# Patient Record
Sex: Female | Born: 1980 | Race: White | Hispanic: No | Smoking: Current every day smoker
Health system: Southern US, Community
[De-identification: ages and names within clinical notes are randomized; demographics above are authoritative.]

## PROBLEM LIST (undated history)

## (undated) HISTORY — PX: BACK SURGERY: SHX140

---

## 2021-02-22 ENCOUNTER — Encounter (HOSPITAL_BASED_OUTPATIENT_CLINIC_OR_DEPARTMENT_OTHER): Payer: Self-pay | Admitting: *Deleted

## 2021-02-22 ENCOUNTER — Emergency Department (HOSPITAL_BASED_OUTPATIENT_CLINIC_OR_DEPARTMENT_OTHER): Payer: Self-pay

## 2021-02-22 ENCOUNTER — Other Ambulatory Visit: Payer: Self-pay

## 2021-02-22 ENCOUNTER — Emergency Department (HOSPITAL_BASED_OUTPATIENT_CLINIC_OR_DEPARTMENT_OTHER)
Admission: EM | Admit: 2021-02-22 | Discharge: 2021-02-23 | Discharge status: Against Medical Advice | Payer: Self-pay | Attending: Emergency Medicine | Admitting: Emergency Medicine

## 2021-02-22 DIAGNOSIS — Y900 Blood alcohol level of less than 20 mg/100 ml: Secondary | ICD-10-CM | POA: Insufficient documentation

## 2021-02-22 DIAGNOSIS — F17210 Nicotine dependence, cigarettes, uncomplicated: Secondary | ICD-10-CM | POA: Insufficient documentation

## 2021-02-22 DIAGNOSIS — R6510 Systemic inflammatory response syndrome (SIRS) of non-infectious origin without acute organ dysfunction: Secondary | ICD-10-CM | POA: Insufficient documentation

## 2021-02-22 DIAGNOSIS — Z5321 Procedure and treatment not carried out due to patient leaving prior to being seen by health care provider: Secondary | ICD-10-CM | POA: Insufficient documentation

## 2021-02-22 DIAGNOSIS — R000 Tachycardia, unspecified: Secondary | ICD-10-CM | POA: Insufficient documentation

## 2021-02-22 DIAGNOSIS — A419 Sepsis, unspecified organism: Secondary | ICD-10-CM | POA: Diagnosis present

## 2021-02-22 DIAGNOSIS — Z20822 Contact with and (suspected) exposure to covid-19: Secondary | ICD-10-CM | POA: Insufficient documentation

## 2021-02-22 DIAGNOSIS — M545 Low back pain, unspecified: Secondary | ICD-10-CM | POA: Insufficient documentation

## 2021-02-22 LAB — RAPID URINE DRUG SCREEN, HOSP PERFORMED
Amphetamines: POSITIVE — AB
Barbiturates: NOT DETECTED
Benzodiazepines: POSITIVE — AB
Cocaine: POSITIVE — AB
Opiates: NOT DETECTED
Tetrahydrocannabinol: NOT DETECTED

## 2021-02-22 LAB — COMPREHENSIVE METABOLIC PANEL
ALT: 33 U/L (ref 0–44)
AST: 46 U/L — ABNORMAL HIGH (ref 15–41)
Albumin: 3.6 g/dL (ref 3.5–5.0)
Alkaline Phosphatase: 86 U/L (ref 38–126)
Anion gap: 11 (ref 5–15)
BUN: 16 mg/dL (ref 6–20)
CO2: 26 mmol/L (ref 22–32)
Calcium: 8.8 mg/dL — ABNORMAL LOW (ref 8.9–10.3)
Chloride: 101 mmol/L (ref 98–111)
Creatinine, Ser: 0.81 mg/dL (ref 0.44–1.00)
GFR, Estimated: >60 mL/min (ref >60)
Glucose, Bld: 113 mg/dL — ABNORMAL HIGH (ref 70–99)
Potassium: 3.4 mmol/L — ABNORMAL LOW (ref 3.5–5.1)
Sodium: 138 mmol/L (ref 135–145)
Total Bilirubin: 0.5 mg/dL (ref 0.3–1.2)
Total Protein: 8.2 g/dL — ABNORMAL HIGH (ref 6.5–8.1)

## 2021-02-22 LAB — CBC WITH DIFFERENTIAL/PLATELET
Abs Immature Granulocytes: 0.09 10*3/uL — ABNORMAL HIGH (ref 0.00–0.07)
Basophils Absolute: 0.1 10*3/uL (ref 0.0–0.1)
Basophils Relative: 1 %
Eosinophils Absolute: 0 10*3/uL (ref 0.0–0.5)
Eosinophils Relative: 0 %
HCT: 30.9 % — ABNORMAL LOW (ref 36.0–46.0)
Hemoglobin: 9.5 g/dL — ABNORMAL LOW (ref 12.0–15.0)
Immature Granulocytes: 1 %
Lymphocytes Relative: 8 %
Lymphs Abs: 1.5 10*3/uL (ref 0.7–4.0)
MCH: 23.1 pg — ABNORMAL LOW (ref 26.0–34.0)
MCHC: 30.7 g/dL (ref 30.0–36.0)
MCV: 75 fL — ABNORMAL LOW (ref 80.0–100.0)
Monocytes Absolute: 1 10*3/uL (ref 0.1–1.0)
Monocytes Relative: 6 %
Neutro Abs: 15.9 10*3/uL — ABNORMAL HIGH (ref 1.7–7.7)
Neutrophils Relative %: 84 %
Platelets: 645 10*3/uL — ABNORMAL HIGH (ref 150–400)
RBC: 4.12 MIL/uL (ref 3.87–5.11)
RDW: 18.8 % — ABNORMAL HIGH (ref 11.5–15.5)
WBC: 18.6 10*3/uL — ABNORMAL HIGH (ref 4.0–10.5)
nRBC: 0 % (ref 0.0–0.2)

## 2021-02-22 LAB — URINALYSIS, ROUTINE W REFLEX MICROSCOPIC
Glucose, UA: NEGATIVE mg/dL
Hgb urine dipstick: NEGATIVE
Ketones, ur: 15 mg/dL — AB
Leukocytes,Ua: NEGATIVE
Nitrite: NEGATIVE
Protein, ur: 30 mg/dL — AB
Specific Gravity, Urine: >1.03 — ABNORMAL HIGH (ref 1.005–1.030)
pH: 5.5 (ref 5.0–8.0)

## 2021-02-22 LAB — ETHANOL: Alcohol, Ethyl (B): <10 mg/dL (ref <10)

## 2021-02-22 LAB — LACTIC ACID, PLASMA: Lactic Acid, Venous: 1.4 mmol/L (ref 0.5–1.9)

## 2021-02-22 LAB — RESP PANEL BY RT-PCR (FLU A&B, COVID) ARPGX2
Influenza A by PCR: NEGATIVE
Influenza B by PCR: NEGATIVE
SARS Coronavirus 2 by RT PCR: NEGATIVE

## 2021-02-22 LAB — TROPONIN I (HIGH SENSITIVITY)
Troponin I (High Sensitivity): 16 ng/L (ref <18)
Troponin I (High Sensitivity): 15 ng/L (ref <18)

## 2021-02-22 LAB — URINALYSIS, MICROSCOPIC (REFLEX)

## 2021-02-22 LAB — PREGNANCY, URINE: Preg Test, Ur: NEGATIVE

## 2021-02-22 MED ORDER — HYDROMORPHONE HCL 1 MG/ML IJ SOLN
1.0000 mg | Freq: Once | INTRAMUSCULAR | Status: AC
Start: 2021-02-22 — End: 2021-02-23
  Administered 2021-02-23: 1 mg via INTRAVENOUS
  Filled 2021-02-22: qty 1

## 2021-02-22 MED ORDER — SODIUM CHLORIDE 0.9 % IV BOLUS
1000.0000 mL | Freq: Once | INTRAVENOUS | Status: AC
  Administered 2021-02-22: 1000 mL via INTRAVENOUS

## 2021-02-22 MED ORDER — KETOROLAC TROMETHAMINE 30 MG/ML IJ SOLN
15.0000 mg | Freq: Once | INTRAMUSCULAR | Status: AC
  Administered 2021-02-22: 15 mg via INTRAVENOUS
  Filled 2021-02-22: qty 1

## 2021-02-22 MED ORDER — SODIUM CHLORIDE 0.9 % IV SOLN
2.0000 g | Freq: Once | INTRAVENOUS | Status: AC
  Administered 2021-02-23: 2 g via INTRAVENOUS
  Filled 2021-02-22: qty 2

## 2021-02-22 MED ORDER — MIDAZOLAM 5 MG/ML ADULT INJ FOR INTRANASAL USE (MC USE ONLY): 5.0000 mg | Freq: Once | INTRAMUSCULAR | Status: DC

## 2021-02-22 MED ORDER — VANCOMYCIN HCL IN DEXTROSE 1-5 GM/200ML-% IV SOLN
1000.0000 mg | Freq: Once | INTRAVENOUS | Status: AC
  Administered 2021-02-23: 1000 mg via INTRAVENOUS
  Filled 2021-02-22: qty 200

## 2021-02-22 NOTE — ED Notes (Signed)
ED Provider at bedside. 

## 2021-02-22 NOTE — ED Notes (Signed)
Cant pull back on the IV for blood and get a second troponin.    Unable to get Second Blood Culture due to get another IV site

## 2021-02-22 NOTE — ED Triage Notes (Signed)
To ED via EMS. She was witnessed staggering on side of the road. Admits to heroine use 24 hours ago. C.o sharp right lower back pain. Hx of back surgery. She is restless. Cooperative.

## 2021-02-22 NOTE — ED Notes (Signed)
Pt requesting something to drink. Per RN Misty pt can have some ice chips. Pt given the same

## 2021-02-22 NOTE — Plan of Care (Signed)
[facility] will assume care on arrival to accepting facility. Until arrival, care as per EDP. However, [facility] available 24/7 for questions and assistance.  

## 2021-02-22 NOTE — ED Provider Notes (Signed)
[facility] EMERGENCY DEPARTMENT Provider Note   CSN: [id number] Arrival date & time: 02/22/21  1825     History Chief Complaint  Patient presents with   Back Pain   Withdrawal    Kerry Spears is a 40 y.o. female.  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  She has a history of osteomyelitis of the thoracic spine, and she was also hospitalized at [facility] in September 2021 for osteomyelitis.  She left AGAINST MEDICAL ADVICE and did not receive further treatment anywhere for this issue.  She had an incomplete course of antibiotics.  She states that the pain today is in a different area than her most frequent bout of osteomyelitis.  The history is provided by the patient.  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 Worsened by:  Movement Ineffective treatments:  Being still Associated symptoms: fever   Associated symptoms: no abdominal pain, no bladder incontinence, no bowel incontinence, no chest pain, no dysuria, no numbness, no paresthesias, no weakness and no weight loss       History reviewed. No pertinent past medical history.  There are no problems to display for this patient.   Past Surgical History:  Procedure Laterality Date   BACK SURGERY       OB History   No obstetric history on file.     No family history on file.  Social History   Tobacco Use   Smoking status: Every Day    Pack years: 0.00    Types: Cigarettes   Smokeless tobacco: Never  Vaping Use   Vaping Use: Every day  Substance Use Topics   Alcohol use: Yes   Drug use: Yes    Comment: heroine 24 hours ago    Home  Medications Prior to Admission medications   Not on File    Allergies    Ondansetron and Penicillins  Review of Systems   Review of Systems  Constitutional:  Positive for fever. Negative for chills and weight loss.  HENT:  Negative for ear pain and sore throat.   Eyes:  Negative for pain and visual disturbance.  Respiratory:  Negative for cough and shortness of breath.   Cardiovascular:  Negative for chest pain and palpitations.  Gastrointestinal:  Negative for abdominal pain, bowel incontinence and vomiting.  Genitourinary:  Negative for bladder incontinence, dysuria and hematuria.  Musculoskeletal:  Positive for back pain. Negative for arthralgias.  Skin:  Negative for color change and rash.  Neurological:  Negative for seizures, syncope, weakness, numbness and paresthesias.  All other systems reviewed and are negative.  Physical Exam Updated Vital Signs BP (!) 130/102   Pulse (!) 115   Temp 98.6 F (37 C) (Oral)   Resp 20   Ht 5' 3.5" (1.613 m)   Wt 48.5 kg   SpO2 100%   BMI 18.66 kg/m   Physical Exam Vitals and nursing note reviewed.  Constitutional:      General: She is not in acute distress.    Appearance: She is well-developed. She is ill-appearing.  HENT:     Head: Normocephalic and atraumatic.  Mouth/Throat:     Comments: Sores around mouth Eyes:     Conjunctiva/sclera: Conjunctivae normal.  Cardiovascular:     Rate and Rhythm: Tachycardia present.  Pulmonary:     Effort: Pulmonary effort is normal. No respiratory distress.  Abdominal:     Palpations: Abdomen is soft.     Tenderness: There is no abdominal tenderness.  Musculoskeletal:        General: No deformity or signs of injury.     Cervical back: Neck supple.     Comments: Scar in her thoracic spine region from prior osteomyelitis surgery with a scar over her left lateral scapular area.  Tender to palpation in the right flank but not the midline of the lumbar region.  Skin:    General: Skin  is warm and dry.     Comments: Fluctuant but spontaneously draining abscess right ulnar forearm; scattered hyperpigmented macules from prior local infections somewhat diffusely on extremities  Neurological:     Mental Status: She is alert.     Sensory: No sensory deficit.     Motor: No weakness.    ED Results / Procedures / Treatments   Labs (all labs ordered are listed, but only abnormal results are displayed) Labs Reviewed  COMPREHENSIVE METABOLIC PANEL - Abnormal; Notable for the following components:      Result Value   Potassium 3.4 (*)    Glucose, Bld 113 (*)    Calcium 8.8 (*)    Total Protein 8.2 (*)    AST 46 (*)    All other components within normal limits  CBC WITH DIFFERENTIAL/PLATELET - Abnormal; Notable for the following components:   WBC 18.6 (*)    Hemoglobin 9.5 (*)    HCT 30.9 (*)    MCV 75.0 (*)    MCH 23.1 (*)    RDW 18.8 (*)    Platelets 645 (*)    Neutro Abs 15.9 (*)    Abs Immature Granulocytes 0.09 (*)    All other components within normal limits  URINALYSIS, ROUTINE W REFLEX MICROSCOPIC - Abnormal; Notable for the following components:   APPearance CLOUDY (*)    Specific Gravity, Urine >1.030 (*)    Bilirubin Urine SMALL (*)    Ketones, ur 15 (*)    Protein, ur 30 (*)    All other components within normal limits  RAPID URINE DRUG SCREEN, HOSP PERFORMED - Abnormal; Notable for the following components:   Cocaine POSITIVE (*)    Benzodiazepines POSITIVE (*)    Amphetamines POSITIVE (*)    All other components within normal limits  URINALYSIS, MICROSCOPIC (REFLEX) - Abnormal; Notable for the following components:   Bacteria, UA RARE (*)    All other components within normal limits  RESP PANEL BY RT-PCR (FLU A&B, COVID) ARPGX2  CULTURE, BLOOD (ROUTINE X 2)  CULTURE, BLOOD (ROUTINE X 2)  ETHANOL  LACTIC ACID, PLASMA  PREGNANCY, URINE  LACTIC ACID, PLASMA  TROPONIN I (HIGH SENSITIVITY)  TROPONIN I (HIGH SENSITIVITY)    EKG EKG  Interpretation  Date/Time:  Thursday February 22 2021 20:51:57 EDT Ventricular Rate:  107 PR Interval:  131 QRS Duration: 76 QT Interval:  338 QTC Calculation: 451 R Axis:   73 Text Interpretation: Sinus tachycardia Probable anteroseptal infarct, old normal axis No acute ischemia Confirmed by Pieter Partridge ([id number]) on 02/22/2021 9:35:10 PM  Radiology DG Chest Port 1 View  Result Date: 02/22/2021 CLINICAL DATA:  Altered mental status with sharp lower right back pain. EXAM: PORTABLE CHEST  1 VIEW COMPARISON:  None. FINDINGS: The heart size and mediastinal contours are within normal limits. Both lungs are clear. Bilateral pedicle screws are seen within the lower thoracic and upper lumbar spine. IMPRESSION: No acute cardiopulmonary disease. Electronically Signed   By: Aram Candelahaddeus  Houston M.D.   On: 02/22/2021 21:31   CT Renal Stone Study  Result Date: 02/22/2021 CLINICAL DATA:  Lambert ModySharp lower right-sided back pain. EXAM: CT ABDOMEN AND PELVIS WITHOUT CONTRAST TECHNIQUE: Multidetector CT imaging of the abdomen and pelvis was performed following the standard protocol without IV contrast. COMPARISON:  None. FINDINGS: Lower chest: No acute abnormality. Hepatobiliary: No focal liver abnormality is seen. There is moderate to marked severity gallbladder distension without evidence of gallstones, gallbladder wall thickening, or biliary dilatation. Pancreas: Unremarkable. No pancreatic ductal dilatation or surrounding inflammatory changes. Spleen: Normal in size without focal abnormality. Adrenals/Urinary Tract: Adrenal glands are unremarkable. Kidneys are normal, without renal calculi, focal lesion, or hydronephrosis. The urinary bladder is partially contracted and subsequently limited in evaluation. Stomach/Bowel: Stomach is within normal limits. The appendix is not clearly identified. No evidence of bowel wall thickening, distention, or inflammatory changes. Vascular/Lymphatic: No significant vascular findings are present. No  enlarged abdominal or pelvic lymph nodes. Reproductive: Uterus and bilateral adnexa are unremarkable. Other: No abdominal wall hernia or abnormality. Surgical clips are seen within the lateral and posterior aspect of the right lower quadrant. No abdominopelvic ascites. Musculoskeletal: Bilateral metallic density pedicle screws are seen within the lower thoracic and upper lumbar spine. Associated streak artifact is seen with subsequently limited evaluation of the adjacent osseous and soft tissue structures. Marked severity chronic changes are seen at the level of L3-L4. IMPRESSION: 1. Distended gallbladder without definite evidence of cholelithiasis or acute cholecystitis. 2. Postoperative changes within the lower thoracic and upper lumbar spine. Electronically Signed   By: Aram Candelahaddeus  Houston M.D.   On: 02/22/2021 21:35    Procedures .Critical Care  Date/Time: 02/22/2021 11:44 PM Performed by: Koleen DistanceWright, Josiel Gahm G, MD Authorized by: Koleen DistanceWright, Patrese Neal G, MD   Critical care provider statement:    Critical care time (minutes):  45   Critical care was necessary to treat or prevent imminent or life-threatening deterioration of the following conditions:  Sepsis   Critical care was time spent personally by me on the following activities:  Discussions with consultants, evaluation of patient's response to treatment, examination of patient, ordering and performing treatments and interventions, ordering and review of laboratory studies, ordering and review of radiographic studies, pulse oximetry, re-evaluation of patient's condition, obtaining history from patient or surrogate and review of old charts   Care discussed with: admitting provider     Medications Ordered in ED Medications  vancomycin (VANCOCIN) IVPB 1000 mg/200 mL premix (has no administration in time range)  ceFEPIme (MAXIPIME) 2 g in sodium chloride 0.9 % 100 mL IVPB (has no administration in time range)  HYDROmorphone (DILAUDID) injection 1 mg (has no  administration in time range)  sodium chloride 0.9 % bolus 1,000 mL (1,000 mLs Intravenous New Bag/Given 02/22/21 2051)  ketorolac (TORADOL) 30 MG/ML injection 15 mg (15 mg Intravenous Given 02/22/21 2105)    ED Course  I have reviewed the triage vital signs and the nursing notes.  Pertinent labs & imaging results that were available during my care of the patient were reviewed by me and considered in my medical decision making (see chart for details).  Clinical Course as of 02/22/21 2331  Thu Feb 22, 2021  2316 I spoke with Loney Lohathore who  will admit her. [AW]    Clinical Course User Index [AW] Koleen Distance, MD   MDM Rules/Calculators/A&P                          Honor Junes presents with fever, IV drug use, and back pain.  She has a history of osteomyelitis that was incompletely treated months ago.  She was noted to be tachycardic.  Anstine count is elevated.  No obvious source of fever based on available emergency department diagnostics.  I do think she will require MRI.  She has been given IV antibiotics and will be transferred to another hospital for admission. Final Clinical Impression(s) / ED Diagnoses Final diagnoses:  Acute right-sided low back pain without sciatica  SIRS (systemic inflammatory response syndrome) (HCC)    Rx / DC Orders ED Discharge Orders     None        Koleen Distance, MD 02/22/21 2344

## 2021-02-23 MED ORDER — HYDROMORPHONE HCL 1 MG/ML IJ SOLN
1.0000 mg | Freq: Once | INTRAMUSCULAR | Status: AC
  Administered 2021-02-23: 1 mg via INTRAVENOUS
  Filled 2021-02-23: qty 1

## 2021-02-23 NOTE — ED Notes (Signed)
Per Night shift pt's behavior and attitude changed by 6 am when Tresa Endo, RN was administrating the pain medicine . Pt boyfriend was permitted to visit prior to that.  Upon rounding,  this RN found the patient in the corner of the room , manipulating her IV playing with the dressing . Pt is known current drug user. Pt's boyfriend  Asleep in bed in sitting position and  appeared to be under influence. Pt startled and appeared surprised upon my arrival in the room prompting  this RN to ask patient if she pushed anything through her IV. Pt denies and her behavior escalated , became verbally aggressive. Pt requests to be transferred for admission  via POV with boyfriend. Security, CN RN and leadership made aware and present at the bedside. Discussed options with patient. Pt decided to leave and refused to sign AMA.  HX of leaving AMA according  to charting .

## 2021-02-23 NOTE — ED Provider Notes (Signed)
41 year old female with history of substance abuse being admitted for possible osteomyelitis Physical Exam  BP (!) 127/93 (BP Location: Left Arm)   Pulse 98   Temp 98.6 F (37 C) (Oral)   Resp 11   Ht 5' 3.5" (1.613 m)   Wt 48.5 kg   SpO2 100%   BMI 18.66 kg/m   Physical Exam  ED Course/Procedures   Clinical Course as of 02/23/21 0847  Thu Feb 22, 2021  2316 I spoke with Loney Loh who will admit her. [AW]    Clinical Course User Index [AW] Koleen Distance, MD    Procedures  MDM  I was informed that the patient is demanding to leave.  She has not been compliant with staying in the room and possibly has been taking some supplies from within the room.  She understands that she may have worsening of her condition and possible infection, paralysis, death.  She declines medical treatment and is demanding to be discharged.      Terrilee Files, MD 02/23/21 [phone]

## 2021-02-27 LAB — CULTURE, BLOOD (ROUTINE X 2)
Culture: NO GROWTH
Special Requests: ADEQUATE

## 2022-02-13 IMAGING — CT CT RENAL STONE PROTOCOL
2 of 3 series · 17 of 46 positions shown, 19 images · non-contrast
Comparison: None.

CLINICAL DATA: Sharp lower right-sided back pain.

EXAM:
CT ABDOMEN AND PELVIS WITHOUT CONTRAST
TECHNIQUE: Multidetector CT imaging of the abdomen and pelvis was performed
following the standard protocol without IV contrast.

[Series 2: axial st · axial · 0.76mm/px · z∈[+1030,+1344]mm · 14 of 73 slices shown, 16 images]
[im 5/73  soft-tissue]
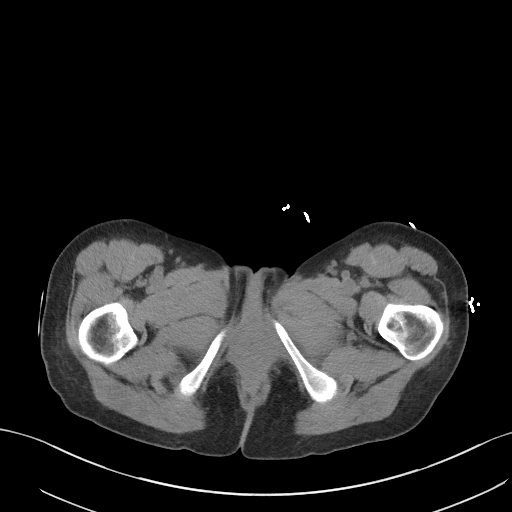
[im 5/73  bone]
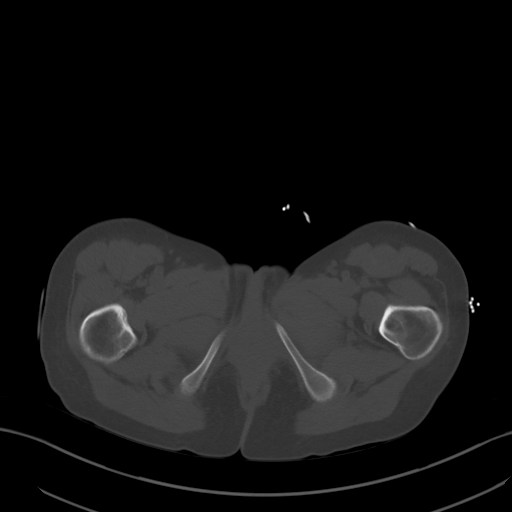
[im 10/73  soft-tissue]
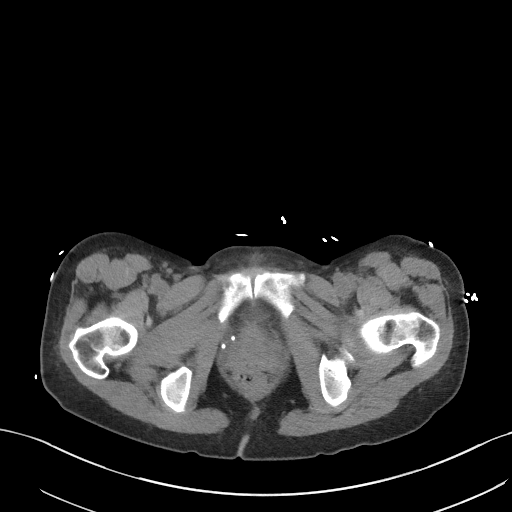
[im 14/73  soft-tissue]
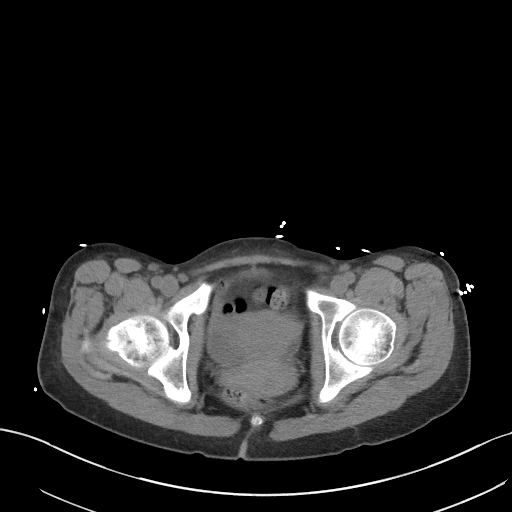
[im 19/73  soft-tissue]
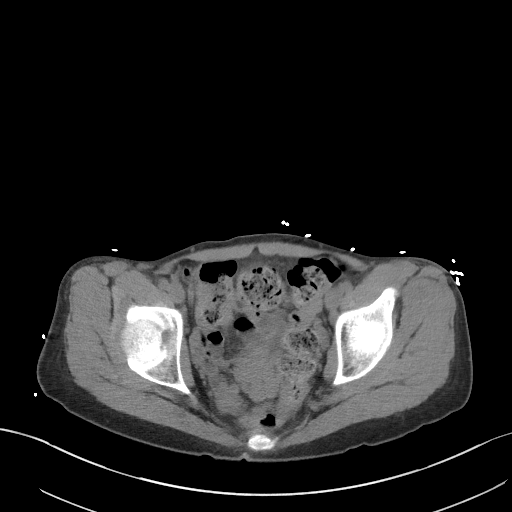
[im 24/73  soft-tissue]
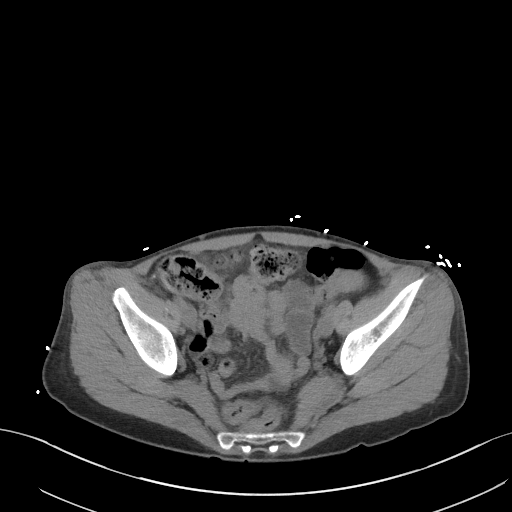
[im 28/73  soft-tissue]
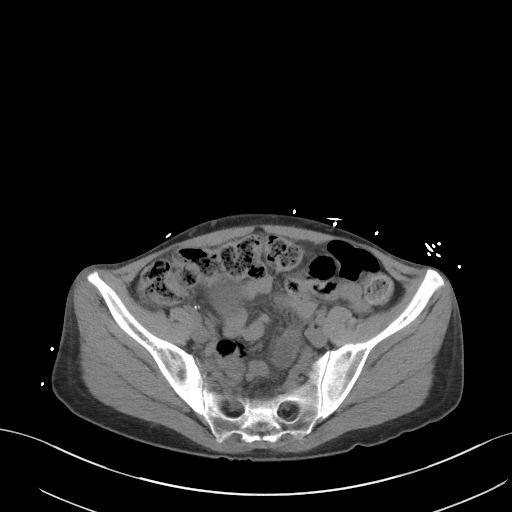
[im 33/73  soft-tissue]
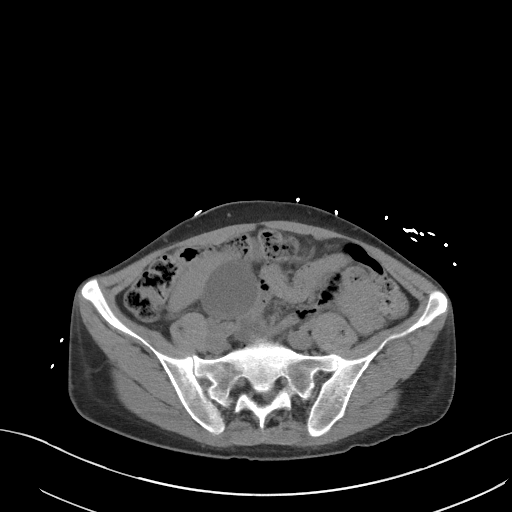
[im 40/73  soft-tissue]
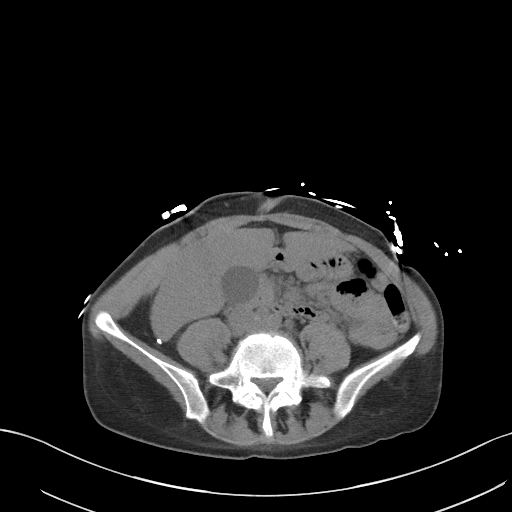
[im 45/73  soft-tissue]
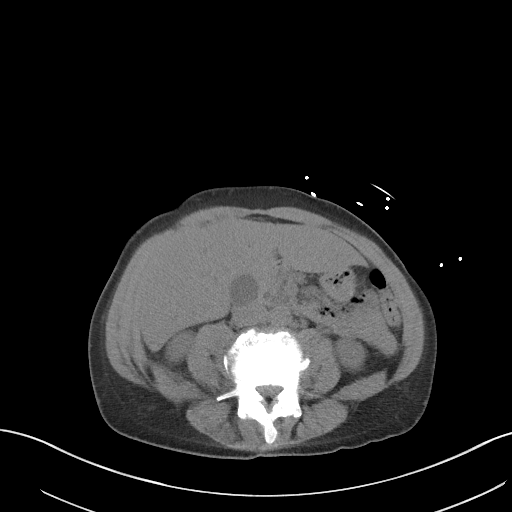
[im 45/73  bone]
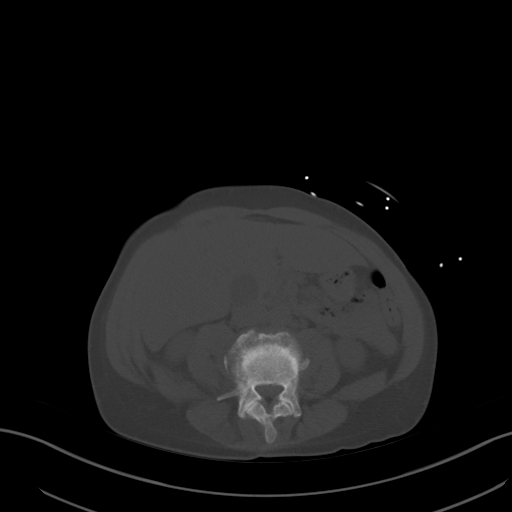
[im 49/73  soft-tissue]
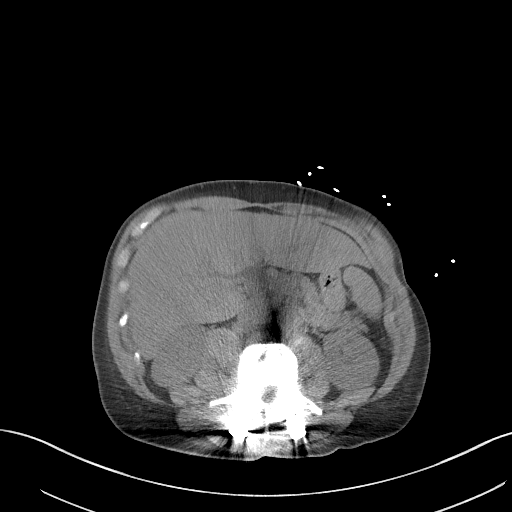
[im 54/73  soft-tissue]
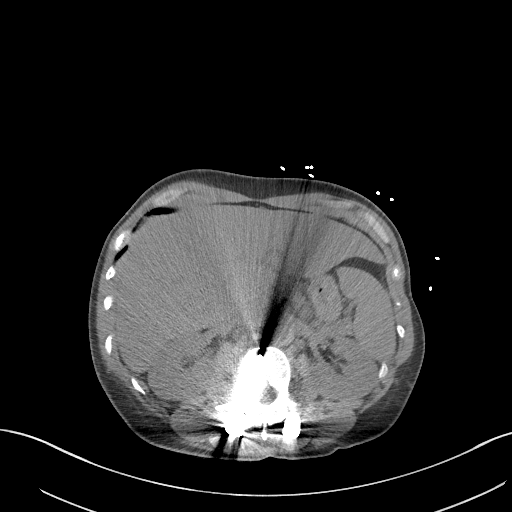
[im 59/73  soft-tissue]
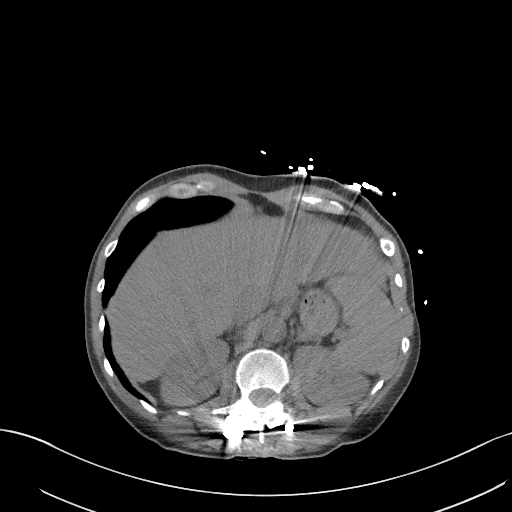
[im 63/73  soft-tissue]
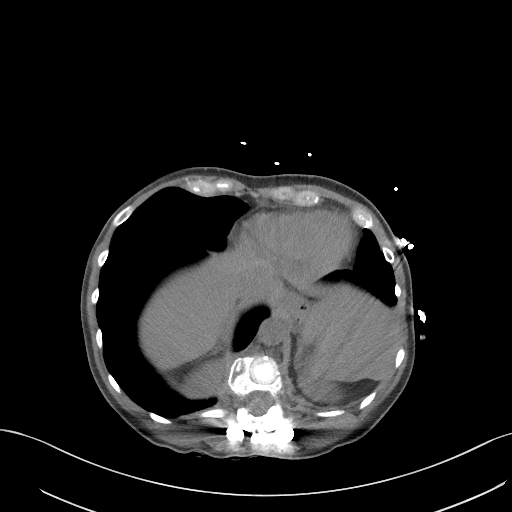
[im 68/73  soft-tissue]
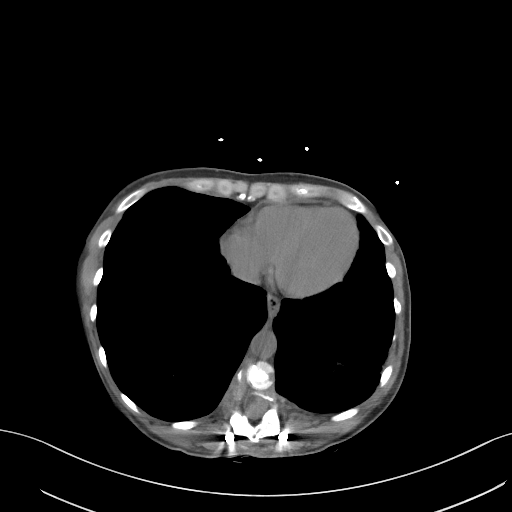

[Series 5: coronal st · coronal · 0.70mm/px · 3 of 83 slices shown]
[im 28/83  soft-tissue]
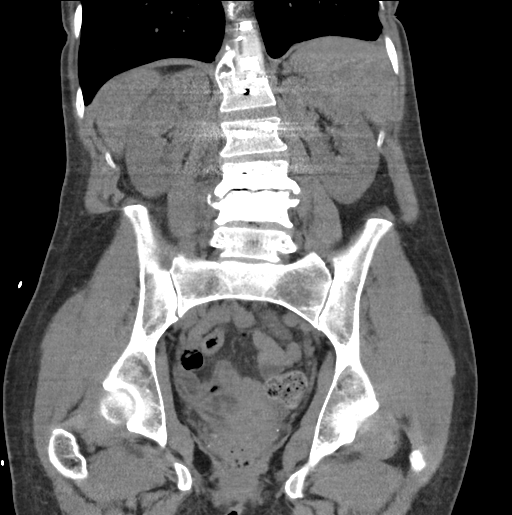
[im 37/83  soft-tissue]
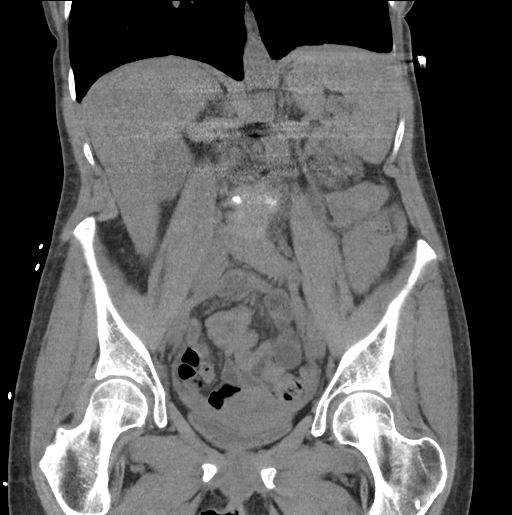
[im 46/83  soft-tissue]
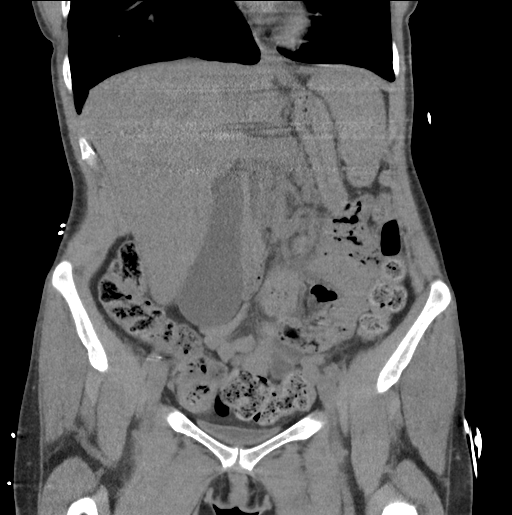

[17 of 46 positions shown; findings below may reference images not displayed]

FINDINGS: Lower chest: No acute abnormality.

Hepatobiliary: No focal liver abnormality is seen. There is moderate
to marked severity gallbladder distension without evidence of
gallstones, gallbladder wall thickening, or biliary dilatation.

Pancreas: Unremarkable. No pancreatic ductal dilatation or
surrounding inflammatory changes.

Spleen: Normal in size without focal abnormality.

Adrenals/Urinary Tract: Adrenal glands are unremarkable. Kidneys are
normal, without renal calculi, focal lesion, or hydronephrosis. The
urinary bladder is partially contracted and subsequently limited in
evaluation.

Stomach/Bowel: Stomach is within normal limits. The appendix is not
clearly identified. No evidence of bowel wall thickening,
distention, or inflammatory changes.

Vascular/Lymphatic: No significant vascular findings are present. No
enlarged abdominal or pelvic lymph nodes.

Reproductive: Uterus and bilateral adnexa are unremarkable.

Other: No abdominal wall hernia or abnormality. Surgical clips are
seen within the lateral and posterior aspect of the right lower
quadrant. No abdominopelvic ascites.

Musculoskeletal: Bilateral metallic density pedicle screws are seen
within the lower thoracic and upper lumbar spine. Associated streak
artifact is seen with subsequently limited evaluation of the
adjacent osseous and soft tissue structures.

Marked severity chronic changes are seen at the level of L3-L4.
IMPRESSION: 1. Distended gallbladder without definite evidence of cholelithiasis
or acute cholecystitis.
2. Postoperative changes within the lower thoracic and upper lumbar
spine.
